# Patient Record
Sex: Male | Born: 1940 | Race: White | Hispanic: No | Marital: Married | Smoking: Never smoker
Health system: Southern US, Community
[De-identification: ages and names within clinical notes are randomized; demographics above are authoritative.]

## PROBLEM LIST (undated history)

## (undated) DIAGNOSIS — M1990 Unspecified osteoarthritis, unspecified site: Secondary | ICD-10-CM

## (undated) DIAGNOSIS — R059 Cough, unspecified: Secondary | ICD-10-CM

## (undated) DIAGNOSIS — H9190 Unspecified hearing loss, unspecified ear: Secondary | ICD-10-CM

## (undated) DIAGNOSIS — R05 Cough: Secondary | ICD-10-CM

## (undated) DIAGNOSIS — I10 Essential (primary) hypertension: Secondary | ICD-10-CM

## (undated) HISTORY — PX: EYE SURGERY: SHX253

## (undated) HISTORY — PX: JOINT REPLACEMENT: SHX530

## (undated) HISTORY — PX: OTHER SURGICAL HISTORY: SHX169

## (undated) HISTORY — PX: BACK SURGERY: SHX140

---

## 2009-11-09 ENCOUNTER — Ambulatory Visit: Payer: Self-pay | Admitting: Ophthalmology

## 2009-11-14 ENCOUNTER — Ambulatory Visit: Payer: Self-pay | Admitting: Ophthalmology

## 2015-08-14 ENCOUNTER — Encounter: Payer: Self-pay | Admitting: Emergency Medicine

## 2015-08-14 ENCOUNTER — Emergency Department
Admission: EM | Admit: 2015-08-14 | Discharge: 2015-08-14 | Disposition: A | Discharge status: Home / Self Care | Payer: Medicare Other | Attending: Emergency Medicine | Admitting: Emergency Medicine

## 2015-08-14 ENCOUNTER — Emergency Department: Payer: Medicare Other

## 2015-08-14 DIAGNOSIS — I10 Essential (primary) hypertension: Secondary | ICD-10-CM | POA: Diagnosis not present

## 2015-08-14 DIAGNOSIS — L03113 Cellulitis of right upper limb: Secondary | ICD-10-CM | POA: Insufficient documentation

## 2015-08-14 DIAGNOSIS — M7989 Other specified soft tissue disorders: Secondary | ICD-10-CM

## 2015-08-14 DIAGNOSIS — M79641 Pain in right hand: Secondary | ICD-10-CM | POA: Diagnosis present

## 2015-08-14 HISTORY — DX: Essential (primary) hypertension: I10

## 2015-08-14 LAB — COMPREHENSIVE METABOLIC PANEL
ALT: 30 U/L (ref 17–63)
AST: 25 U/L (ref 15–41)
Albumin: 4 g/dL (ref 3.5–5.0)
Alkaline Phosphatase: 42 U/L (ref 38–126)
Anion gap: 5 (ref 5–15)
BUN: 20 mg/dL (ref 6–20)
CO2: 26 mmol/L (ref 22–32)
CREATININE: 0.93 mg/dL (ref 0.61–1.24)
Calcium: 9.1 mg/dL (ref 8.9–10.3)
Chloride: 105 mmol/L (ref 101–111)
GFR calc Af Amer: >60 mL/min (ref >60)
GFR calc non Af Amer: >60 mL/min (ref >60)
Glucose, Bld: 91 mg/dL (ref 65–99)
Potassium: 4.5 mmol/L (ref 3.5–5.1)
SODIUM: 136 mmol/L (ref 135–145)
Total Bilirubin: 0.8 mg/dL (ref 0.3–1.2)
Total Protein: 7.1 g/dL (ref 6.5–8.1)

## 2015-08-14 LAB — CBC WITH DIFFERENTIAL/PLATELET
BASOS ABS: 0.1 10*3/uL (ref 0–0.1)
Basophils Relative: 1 %
EOS ABS: 0.3 10*3/uL (ref 0–0.7)
EOS PCT: 4 %
HCT: 45.7 % (ref 40.0–52.0)
Hemoglobin: 15.5 g/dL (ref 13.0–18.0)
LYMPHS ABS: 3.4 10*3/uL (ref 1.0–3.6)
Lymphocytes Relative: 44 %
MCH: 30.8 pg (ref 26.0–34.0)
MCHC: 34 g/dL (ref 32.0–36.0)
MCV: 90.6 fL (ref 80.0–100.0)
Monocytes Absolute: 1 10*3/uL (ref 0.2–1.0)
Monocytes Relative: 13 %
NEUTROS PCT: 38 %
Neutro Abs: 2.9 10*3/uL (ref 1.4–6.5)
PLATELETS: 127 10*3/uL — AB (ref 150–440)
RBC: 5.04 MIL/uL (ref 4.40–5.90)
RDW: 13.4 % (ref 11.5–14.5)
WBC: 7.6 10*3/uL (ref 3.8–10.6)

## 2015-08-14 LAB — URIC ACID: Uric Acid, Serum: 7.3 mg/dL (ref 4.4–7.6)

## 2015-08-14 MED ORDER — HYDROCODONE-ACETAMINOPHEN 5-325 MG PO TABS
1.0000 | ORAL_TABLET | Freq: Once | ORAL | Status: AC
  Administered 2015-08-14: 1 via ORAL

## 2015-08-14 MED ORDER — HYDROCODONE-ACETAMINOPHEN 5-325 MG PO TABS
ORAL_TABLET | ORAL | Status: DC
Start: 2015-08-14 — End: 2015-08-14
  Filled 2015-08-14: qty 1

## 2015-08-14 MED ORDER — ETODOLAC 200 MG PO CAPS: 200.0000 mg | ORAL_CAPSULE | Freq: Three times a day (TID) | ORAL | Status: DC

## 2015-08-14 MED ORDER — HYDROCODONE-ACETAMINOPHEN 5-325 MG PO TABS
ORAL_TABLET | ORAL | Status: AC
  Filled 2015-08-14: qty 1

## 2015-08-14 MED ORDER — CLINDAMYCIN HCL 300 MG PO CAPS: 300.0000 mg | ORAL_CAPSULE | Freq: Three times a day (TID) | ORAL | Status: DC

## 2015-08-14 MED ORDER — CLINDAMYCIN HCL 150 MG PO CAPS
300.0000 mg | ORAL_CAPSULE | Freq: Once | ORAL | Status: AC
  Administered 2015-08-14: 300 mg via ORAL

## 2015-08-14 MED ORDER — CLINDAMYCIN HCL 150 MG PO CAPS
ORAL_CAPSULE | ORAL | Status: AC
  Filled 2015-08-14: qty 2

## 2015-08-14 NOTE — ED Notes (Signed)
MD Webster at bedside 

## 2015-08-14 NOTE — ED Notes (Signed)
Patient transported to Ultrasound 

## 2015-08-14 NOTE — ED Notes (Signed)
Pt reports he noticed swelling/redness/pain of right hand, extending towards elbow beginning at 18:30 on 3/26. Pt is able to extend finger, pt is unable to make a fist. Pt denies nausea, vomiting, fever/chills

## 2015-08-14 NOTE — ED Notes (Signed)
Pt states that he started having pain in the rt hand around 1830, pt states that he then noticed that his hand was swollen with pain up to the rt elbow, denies any injury, pt has some redness noted o the rt hand and rt wrist area. +radial pulse, warm to touch, pt able to move his fingers but with pain

## 2015-08-14 NOTE — ED Provider Notes (Signed)
[facility] Emergency Department Provider Note  ____________________________________________  Time seen: Approximately 424 AM  I have reviewed the triage vital signs and the nursing notes.   HISTORY  Chief Complaint Hand Pain    HPI Kevin Butler is a 75 y.o. male who comes into the hospital today with right hand pain and swelling.The patient reports that it started around 6:30 to 7 PM this evening with some swelling of his hand and pain into his right elbow. He also noticed some redness in his hand and redness going up his arm. 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 The patient reports he has some circulation problems and has had that in the past. He took some ibuprofen and it didn't help. The patient rates pain 8 out of 10 in intensity. He was given hydrocodone and the waiting room and he reports to help some but the pain is coming back. The patient is unsure what may be causing his symptoms so he decided to come in to the hospital to get checked out.   Past Medical History  Diagnosis Date  . Hypertension     There are no active problems to display for this patient.   Past Surgical History  Procedure Laterality Date  . Back surgery    . Joint replacement      Current Outpatient Rx  Name  Route  Sig  Dispense  Refill  . clindamycin (CLEOCIN) 300 MG capsule   Oral   Take 1 capsule (300 mg total) by mouth 3 (three) times daily.   30 capsule   0   . etodolac (LODINE) 200 MG capsule   Oral   Take 1 capsule (200 mg total) by mouth every 8 (eight) hours.   12 capsule   0     Allergies Iodine  No family history on file.  Social History Social History  Substance Use Topics  . Smoking status: Never Smoker   . Smokeless tobacco: Not on file  . Alcohol Use: Yes    Review of Systems Constitutional: No fever/chills Eyes: No visual changes. ENT: No sore throat. Cardiovascular: Denies chest  pain. Respiratory: Denies shortness of breath. Gastrointestinal: No abdominal pain.  No nausea, no vomiting.  No diarrhea.  No constipation. Genitourinary: Negative for dysuria. Musculoskeletal: right and swelling and pain Skin: right hand redness Neurological: Negative for headaches, focal weakness or numbness.  10-point ROS otherwise negative.  ____________________________________________   PHYSICAL EXAM:  VITAL SIGNS: ED Triage Vitals  Enc Vitals Group     BP 08/14/15 0015 167/78 mmHg     Pulse Rate 08/14/15 0015 73     Resp 08/14/15 0015 20     Temp 08/14/15 0015 97.6 F (36.4 C)     Temp Source 08/14/15 0015 Oral     SpO2 08/14/15 0015 98 %     Weight 08/14/15 0015 238 lb (107.956 kg)     Height 08/14/15 0015 5\' 11"  (1.803 m)     Head Cir --      Peak Flow --      Pain Score 08/14/15 0015 9     Pain Loc --      Pain Edu? --      Excl. in GC? --     Constitutional: Alert and oriented. Well appearing and in no acute distress. Eyes: Conjunctivae are normal. PERRL. EOMI. Head: Atraumatic. Nose: No congestion/rhinnorhea. Mouth/Throat: Mucous membranes are moist.  Oropharynx non-erythematous. Cardiovascular: Normal rate,  regular rhythm. Grossly normal heart sounds.  Good peripheral circulation. Respiratory: Normal respiratory effort.  No retractions. Lungs CTAB. Gastrointestinal: Soft and nontender. No distention.positive bowel sounds Musculoskeletal: Swelling to right hand with some mild redness. The patient has some mild streaking on the medial surface of his right forearm. Minimal tenderness to palpation of right forearm but some mild tenderness to palpation of right hand. Neurologic:  Normal speech and language.  Skin:  Skin is warm, dry and intact.  Psychiatric: Mood and affect are normal.   ____________________________________________   LABS (all labs ordered are listed, but only abnormal results are displayed)  Labs Reviewed  CBC WITH DIFFERENTIAL/PLATELET  - Abnormal; Notable for the following:    Platelets 127 (*)    All other components within normal limits  COMPREHENSIVE METABOLIC PANEL  URIC ACID   ____________________________________________  EKG  None ____________________________________________  RADIOLOGY  Ultrasound venous right upper extremity: No evidence of DVT ____________________________________________   PROCEDURES  Procedure(s) performed: None  Critical Care performed: No  ____________________________________________   INITIAL IMPRESSION / ASSESSMENT AND PLAN / ED COURSE  Pertinent labs & imaging results that were available during my care of the patient were reviewed by me and considered in my medical decision making (see chart for details).  This is a 76 year old male who comes into the hospital today with some right hand swelling and pain as well as some redness to his right hand. The patient does have a scratch to his right medial forearm. I'm concerned the patient may have some cellulitis which may be causing his symptoms. The patient may also have some arthritis. I will give the patient a dose of clindamycin as well as a repeat dose of his hydrocodone. I will treat the patient with etodolac and clindamycin and have him follow up with his primary care physician.   The patient's ultrasound is unremarkable. His blood work is also negative. He'll be discharged home to follow-up with his primary care physician for further evaluation of this hand pain. ____________________________________________   FINAL CLINICAL IMPRESSION(S) / ED DIAGNOSES  Final diagnoses:  Hand swelling  Cellulitis of right upper extremity      Rebecka Apley, MD 08/14/15 [phone]

## 2015-08-14 NOTE — Discharge Instructions (Signed)
Cellulitis °Cellulitis is an infection of the skin and the tissue beneath it. The infected area is usually red and tender. Cellulitis occurs most often in the arms and lower legs.  °CAUSES  °Cellulitis is caused by bacteria that enter the skin through cracks or cuts in the skin. The most common types of bacteria that cause cellulitis are staphylococci and streptococci. °SIGNS AND SYMPTOMS  °· Redness and warmth. °· Swelling. °· Tenderness or pain. °· Fever. °DIAGNOSIS  °Your health care provider can usually determine what is wrong based on a physical exam. Blood tests may also be done. °TREATMENT  °Treatment usually involves taking an antibiotic medicine. °HOME CARE INSTRUCTIONS  °· Take your antibiotic medicine as directed by your health care provider. Finish the antibiotic even if you start to feel better. °· Keep the infected arm or leg elevated to reduce swelling. °· Apply a warm cloth to the affected area up to 4 times per day to relieve pain. °· Take medicines only as directed by your health care provider. °· Keep all follow-up visits as directed by your health care provider. °SEEK MEDICAL CARE IF:  °· You notice red streaks coming from the infected area. °· Your red area gets larger or turns dark in color. °· Your bone or joint underneath the infected area becomes painful after the skin has healed. °· Your infection returns in the same area or another area. °· You notice a swollen bump in the infected area. °· You develop new symptoms. °· You have a fever. °SEEK IMMEDIATE MEDICAL CARE IF:  °· You feel very sleepy. °· You develop vomiting or diarrhea. °· You have a general ill feeling (malaise) with muscle aches and pains. °  °This information is not intended to replace advice given to you by your health care provider. Make sure you discuss any questions you have with your health care provider. °  °Document Released: 02/13/2005 Document Revised: 01/25/2015 Document Reviewed: 07/22/2011 °Elsevier Interactive  Patient Education ©2016 [employer] ° °Edema °Edema is an abnormal buildup of fluids in your body tissues. Edema is somewhat dependent on gravity to pull the fluid to the lowest place in your body. That makes the condition more common in the legs and thighs (lower extremities). Painless swelling of the feet and ankles is common and becomes more likely as you get older. It is also common in looser tissues, like around your eyes.  °When the affected area is squeezed, the fluid may move out of that spot and leave a dent for a few moments. This dent is called pitting.  °CAUSES  °There are many possible causes of edema. Eating too much salt and being on your feet or sitting for a long time can cause edema in your legs and ankles. Hot weather may make edema worse. Common medical causes of edema include: °· Heart failure. °· Liver disease. °· Kidney disease. °· Weak blood vessels in your legs. °· Cancer. °· An injury. °· Pregnancy. °· Some medications. °· Obesity.  °SYMPTOMS  °Edema is usually painless. Your skin may look swollen or shiny.  °DIAGNOSIS  °Your health care provider may be able to diagnose edema by asking about your medical history and doing a physical exam. You may need to have tests such as X-rays, an electrocardiogram, or blood tests to check for medical conditions that may cause edema.  °TREATMENT  °Edema treatment depends on the cause. If you have heart, liver, or kidney disease, you need the treatment appropriate for these conditions. General treatment may include: °·   Elevation of the affected body part above the level of your heart. °· Compression of the affected body part. Pressure from elastic bandages or support stockings squeezes the tissues and forces fluid back into the blood vessels. This keeps fluid from entering the tissues. °· Restriction of fluid and salt intake. °· Use of a water pill (diuretic). These medications are appropriate only for some types of edema. They pull fluid out of your  body and make you urinate more often. This gets rid of fluid and reduces swelling, but diuretics can have side effects. Only use diuretics as directed by your health care provider. °HOME CARE INSTRUCTIONS  °· Keep the affected body part above the level of your heart when you are lying down.   °· Do not sit still or stand for prolonged periods.   °· Do not put anything directly under your knees when lying down. °· Do not wear constricting clothing or garters on your upper legs.   °· Exercise your legs to work the fluid back into your blood vessels. This may help the swelling go down.   °· Wear elastic bandages or support stockings to reduce ankle swelling as directed by your health care provider.   °· Eat a low-salt diet to reduce fluid if your health care provider recommends it.   °· Only take medicines as directed by your health care provider.  °SEEK MEDICAL CARE IF:  °· Your edema is not responding to treatment. °· You have heart, liver, or kidney disease and notice symptoms of edema. °· You have edema in your legs that does not improve after elevating them.   °· You have sudden and unexplained weight gain. °SEEK IMMEDIATE MEDICAL CARE IF:  °· You develop shortness of breath or chest pain.   °· You cannot breathe when you lie down. °· You develop pain, redness, or warmth in the swollen areas.   °· You have heart, liver, or kidney disease and suddenly get edema. °· You have a fever and your symptoms suddenly get worse. °MAKE SURE YOU:  °· Understand these instructions. °· Will watch your condition. °· Will get help right away if you are not doing well or get worse. °  °This information is not intended to replace advice given to you by your health care provider. Make sure you discuss any questions you have with your health care provider. °  °Document Released: 05/06/2005 Document Revised: 05/27/2014 Document Reviewed: 02/26/2013 °Elsevier Interactive Patient Education ©2016 Elsevier Inc. ° °

## 2017-02-27 ENCOUNTER — Encounter: Payer: Self-pay | Admitting: *Deleted

## 2017-03-04 ENCOUNTER — Ambulatory Visit: Payer: Medicare Other | Admitting: Anesthesiology

## 2017-03-04 ENCOUNTER — Ambulatory Visit
Admission: RE | Admit: 2017-03-04 | Discharge: 2017-03-04 | Disposition: A | Discharge status: Home / Self Care | Payer: Medicare Other | Source: Ambulatory Visit | Attending: Ophthalmology | Admitting: Ophthalmology

## 2017-03-04 ENCOUNTER — Encounter
Admission: RE | Disposition: A | Discharge status: Home / Self Care | Payer: Self-pay | Source: Ambulatory Visit | Attending: Ophthalmology

## 2017-03-04 DIAGNOSIS — E669 Obesity, unspecified: Secondary | ICD-10-CM | POA: Diagnosis not present

## 2017-03-04 DIAGNOSIS — Z91041 Radiographic dye allergy status: Secondary | ICD-10-CM | POA: Insufficient documentation

## 2017-03-04 DIAGNOSIS — R05 Cough: Secondary | ICD-10-CM | POA: Diagnosis not present

## 2017-03-04 DIAGNOSIS — H2511 Age-related nuclear cataract, right eye: Secondary | ICD-10-CM | POA: Diagnosis not present

## 2017-03-04 DIAGNOSIS — Z79899 Other long term (current) drug therapy: Secondary | ICD-10-CM | POA: Diagnosis not present

## 2017-03-04 DIAGNOSIS — Z96659 Presence of unspecified artificial knee joint: Secondary | ICD-10-CM | POA: Diagnosis not present

## 2017-03-04 DIAGNOSIS — H9190 Unspecified hearing loss, unspecified ear: Secondary | ICD-10-CM | POA: Insufficient documentation

## 2017-03-04 DIAGNOSIS — M1990 Unspecified osteoarthritis, unspecified site: Secondary | ICD-10-CM | POA: Insufficient documentation

## 2017-03-04 DIAGNOSIS — I10 Essential (primary) hypertension: Secondary | ICD-10-CM | POA: Diagnosis not present

## 2017-03-04 DIAGNOSIS — Z9849 Cataract extraction status, unspecified eye: Secondary | ICD-10-CM | POA: Diagnosis not present

## 2017-03-04 HISTORY — DX: Unspecified hearing loss, unspecified ear: H91.90

## 2017-03-04 HISTORY — DX: Unspecified osteoarthritis, unspecified site: M19.90

## 2017-03-04 HISTORY — DX: Cough: R05

## 2017-03-04 HISTORY — PX: CATARACT EXTRACTION W/PHACO: SHX586

## 2017-03-04 HISTORY — DX: Cough, unspecified: R05.9

## 2017-03-04 SURGERY — PHACOEMULSIFICATION, CATARACT, WITH IOL INSERTION
Anesthesia: Monitor Anesthesia Care | Site: Eye | Laterality: Right | Wound class: Clean

## 2017-03-04 MED ORDER — NA CHONDROIT SULF-NA HYALURON 40-17 MG/ML IO SOLN
INTRAOCULAR | Status: DC | PRN
  Administered 2017-03-04: 1 mL via INTRAOCULAR

## 2017-03-04 MED ORDER — ARMC OPHTHALMIC DILATING DROPS
OPHTHALMIC | Status: AC
  Filled 2017-03-04: qty 0.4

## 2017-03-04 MED ORDER — NA CHONDROIT SULF-NA HYALURON 40-17 MG/ML IO SOLN
INTRAOCULAR | Status: AC
  Filled 2017-03-04: qty 1

## 2017-03-04 MED ORDER — MOXIFLOXACIN HCL 0.5 % OP SOLN
OPHTHALMIC | Status: AC
  Filled 2017-03-04: qty 3

## 2017-03-04 MED ORDER — MIDAZOLAM HCL 5 MG/5ML IJ SOLN
INTRAMUSCULAR | Status: DC | PRN
  Administered 2017-03-04: 2 mg via INTRAVENOUS

## 2017-03-04 MED ORDER — EPINEPHRINE PF 1 MG/ML IJ SOLN
INTRAOCULAR | Status: DC | PRN
  Administered 2017-03-04: 1 mL via OPHTHALMIC

## 2017-03-04 MED ORDER — MOXIFLOXACIN HCL 0.5 % OP SOLN
OPHTHALMIC | Status: DC | PRN
  Administered 2017-03-04: .2 mL via OPHTHALMIC

## 2017-03-04 MED ORDER — LIDOCAINE HCL (PF) 4 % IJ SOLN
INTRAOCULAR | Status: DC | PRN
  Administered 2017-03-04: 2 mL via OPHTHALMIC

## 2017-03-04 MED ORDER — MOXIFLOXACIN HCL 0.5 % OP SOLN: 1.0000 [drp] | OPHTHALMIC | Status: DC | PRN

## 2017-03-04 MED ORDER — SODIUM CHLORIDE 0.9 % IV SOLN
INTRAVENOUS | Status: DC
  Administered 2017-03-04 (×2): via INTRAVENOUS

## 2017-03-04 MED ORDER — MIDAZOLAM HCL 2 MG/2ML IJ SOLN
INTRAMUSCULAR | Status: AC
  Filled 2017-03-04: qty 2

## 2017-03-04 MED ORDER — LIDOCAINE HCL (PF) 4 % IJ SOLN
INTRAMUSCULAR | Status: AC
  Filled 2017-03-04: qty 5

## 2017-03-04 MED ORDER — ARMC OPHTHALMIC DILATING DROPS
1.0000 "application " | OPHTHALMIC | Status: AC
  Administered 2017-03-04 (×3): 1 via OPHTHALMIC

## 2017-03-04 MED ORDER — POVIDONE-IODINE 5 % OP SOLN
OPHTHALMIC | Status: AC
  Filled 2017-03-04: qty 30

## 2017-03-04 MED ORDER — POVIDONE-IODINE 5 % OP SOLN
OPHTHALMIC | Status: DC | PRN
  Administered 2017-03-04: 1 via OPHTHALMIC

## 2017-03-04 MED ORDER — EPINEPHRINE PF 1 MG/ML IJ SOLN
INTRAMUSCULAR | Status: AC
  Filled 2017-03-04: qty 1

## 2017-03-04 MED ORDER — CARBACHOL 0.01 % IO SOLN
INTRAOCULAR | Status: DC | PRN
  Administered 2017-03-04: .5 mL via INTRAOCULAR

## 2017-03-04 SURGICAL SUPPLY — 17 items
GLOVE BIO SURGEON STRL SZ8 (GLOVE) ×3 IMPLANT
GLOVE BIOGEL M 6.5 STRL (GLOVE) ×3 IMPLANT
GLOVE SURG LX 8.0 MICRO (GLOVE) ×2
GLOVE SURG LX STRL 8.0 MICRO (GLOVE) ×1 IMPLANT
GOWN STRL REUS W/ TWL LRG LVL3 (GOWN DISPOSABLE) ×2 IMPLANT
GOWN STRL REUS W/TWL LRG LVL3 (GOWN DISPOSABLE) ×4
LABEL CATARACT MEDS ST (LABEL) ×3 IMPLANT
LENS IOL ACRSF IQ ULTRA 17.0 (Intraocular Lens) ×1 IMPLANT
LENS IOL ACRYSOF IQ 17.0 (Intraocular Lens) ×3 IMPLANT
PACK CATARACT (MISCELLANEOUS) ×3 IMPLANT
PACK CATARACT BRASINGTON LX (MISCELLANEOUS) ×3 IMPLANT
PACK EYE AFTER SURG (MISCELLANEOUS) ×3 IMPLANT
SOL BSS BAG (MISCELLANEOUS) ×3
SOLUTION BSS BAG (MISCELLANEOUS) ×1 IMPLANT
SYR 5ML LL (SYRINGE) ×3 IMPLANT
WATER STERILE IRR 250ML POUR (IV SOLUTION) ×3 IMPLANT
WIPE NON LINTING 3.25X3.25 (MISCELLANEOUS) ×3 IMPLANT

## 2017-03-04 NOTE — H&P (Signed)
All labs reviewed. Abnormal studies sent to patients PCP when indicated.  Previous H&P reviewed, patient examined, there are NO CHANGES.  Stacey Sago LOUIS10/16/201811:39 AM

## 2017-03-04 NOTE — Anesthesia Post-op Follow-up Note (Signed)
Anesthesia QCDR form completed.        

## 2017-03-04 NOTE — Anesthesia Preprocedure Evaluation (Addendum)
Anesthesia Evaluation  Patient identified by MRN, date of birth, ID band Patient awake    Reviewed: Allergy & Precautions, NPO status , Patient's Chart, lab work & pertinent test results, reviewed documented beta blocker date and time   Airway Mallampati: III  TM Distance: >3 FB     Dental  (+) Chipped, Missing   Pulmonary           Cardiovascular hypertension, Pt. on medications      Neuro/Psych    GI/Hepatic   Endo/Other    Renal/GU      Musculoskeletal  (+) Arthritis ,   Abdominal   Peds  Hematology   Anesthesia Other Findings Obese.  Reproductive/Obstetrics                            Anesthesia Physical Anesthesia Plan  ASA: III  Anesthesia Plan: MAC   Post-op Pain Management:    Induction:   PONV Risk Score and Plan:   Airway Management Planned:   Additional Equipment:   Intra-op Plan:   Post-operative Plan:   Informed Consent: I have reviewed the patients History and Physical, chart, labs and discussed the procedure including the risks, benefits and alternatives for the proposed anesthesia with the patient or authorized representative who has indicated his/her understanding and acceptance.     Plan Discussed with: CRNA  Anesthesia Plan Comments:         Anesthesia Quick Evaluation

## 2017-03-04 NOTE — Anesthesia Postprocedure Evaluation (Signed)
Anesthesia Post Note  Patient: Kevin Butler  Procedure(s) Performed: CATARACT EXTRACTION PHACO AND INTRAOCULAR LENS PLACEMENT (IOC) (Right Eye)  Patient location during evaluation: PACU Anesthesia Type: MAC Level of consciousness: awake and alert Pain management: pain level controlled Vital Signs Assessment: post-procedure vital signs reviewed and stable Respiratory status: spontaneous breathing, nonlabored ventilation and respiratory function stable Cardiovascular status: stable and blood pressure returned to baseline Postop Assessment: no apparent nausea or vomiting Anesthetic complications: no     Last Vitals:  Vitals:   03/04/17 1001 03/04/17 1208  BP: (!) 169/98 (!) 141/93  Pulse: 86   Resp: 16   Temp: 36.6 C 36.6 C  SpO2: 96% 100%    Last Pain:  Vitals:   03/04/17 1001  TempSrc: Oral                 Silvana Newness A

## 2017-03-04 NOTE — OR Nursing (Signed)
Dr Druscilla Brownie assessed left eye for infection. No new orders at this time

## 2017-03-04 NOTE — Discharge Instructions (Signed)
Eye Surgery Discharge Instructions  Expect mild scratchy sensation or mild soreness. DO NOT RUB YOUR EYE!  The day of surgery:  Minimal physical activity, but bed rest is not required  No reading, computer work, or close hand work  No bending, lifting, or straining.  May watch TV  For 24 hours:  No driving, legal decisions, or alcoholic beverages  Safety precautions  Eat anything you prefer: It is better to start with liquids, then soup then solid foods.  _____ Eye patch should be worn until postoperative exam tomorrow.  ____ Solar shield eyeglasses should be worn for comfort in the sunlight/patch while sleeping  Resume all regular medications including aspirin or Coumadin if these were discontinued prior to surgery. You may shower, bathe, shave, or wash your hair. Tylenol may be taken for mild discomfort.  Call your doctor if you experience significant pain, nausea, or vomiting, fever > 101 or other signs of infection. [phone] or [phone] Specific instructions:  Follow-up Information    Galen Manila, MD Follow up on 03/05/2017.   Specialty:  Ophthalmology Why:  9:55 Contact information: [address] [place] [address] [phone]

## 2017-03-04 NOTE — Transfer of Care (Signed)
Immediate Anesthesia Transfer of Care Note  Patient: Kevin Butler  Procedure(s) Performed: CATARACT EXTRACTION PHACO AND INTRAOCULAR LENS PLACEMENT (IOC) (Right Eye)  Patient Location: PACU  Anesthesia Type:MAC  Level of Consciousness: awake, alert , oriented and patient cooperative  Airway & Oxygen Therapy: Patient Spontanous Breathing  Post-op Assessment: Report given to RN, Post -op Vital signs reviewed and stable and Patient moving all extremities X 4  Post vital signs: Reviewed and stable  Last Vitals:  Vitals:   03/04/17 1001 03/04/17 1208  BP: (!) 169/98 (!) 141/93  Pulse: 86   Resp: 16   Temp: 36.6 C 36.6 C  SpO2: 96% 100%    Last Pain:  Vitals:   03/04/17 1001  TempSrc: Oral         Complications: No apparent anesthesia complications

## 2017-03-04 NOTE — Op Note (Signed)
PREOPERATIVE DIAGNOSIS:  Nuclear sclerotic cataract of the right eye.   POSTOPERATIVE DIAGNOSIS:  nuclear sclerotic cataract right eye   OPERATIVE PROCEDURE: Procedure(s): CATARACT EXTRACTION PHACO AND INTRAOCULAR LENS PLACEMENT (IOC)   SURGEON:  Oralee Rapaport Galen Manila   ANESTHESIA:  Anesthesiologist: Berdine Addison, MD CRNA: Michaele Offer, CRNA  1.      Managed anesthesia care. 2.      0.48ml of Shugarcaine was instilled in the eye following the paracentesis.   COMPLICATIONS:  None.   TECHNIQUE:   Stop and chop   DESCRIPTION OF PROCEDURE:  The patient was examined and consented in the preoperative holding area where the aforementioned topical anesthesia was applied to the right eye and then brought back to the Operating Room where the right eye was prepped and draped in the usual sterile ophthalmic fashion and a lid speculum was placed. A paracentesis was created with the side port blade and the anterior chamber was filled with viscoelastic. A near clear corneal incision was performed with the steel keratome. A continuous curvilinear capsulorrhexis was performed with a cystotome followed by the capsulorrhexis forceps. Hydrodissection and hydrodelineation were carried out with BSS on a blunt cannula. The lens was removed in a stop and chop  technique and the remaining cortical material was removed with the irrigation-aspiration handpiece. The capsular bag was inflated with viscoelastic and the Technis [id number]  lens was placed in the capsular bag without complication. The remaining viscoelastic was removed from the eye with the irrigation-aspiration handpiece. The wounds were hydrated. The anterior chamber was flushed with Miostat and the eye was inflated to physiologic pressure. 0.57ml of Vigamox was placed in the anterior chamber. The wounds were found to be water tight. The eye was dressed with Vigamox. The patient was given protective glasses to wear throughout the day and a shield with which to  sleep tonight. The patient was also given drops with which to begin a drop regimen today and will follow-up with me in one day.  Implant Name Type Inv. Item Serial No. Manufacturer Lot No. LRB No. Used  LENS IOL ACRYSOF IQ 17.0 - [id number] Intraocular Lens LENS IOL ACRYSOF IQ 17.0 [id number] ALCON   Right 1   Procedure(s) with comments: CATARACT EXTRACTION PHACO AND INTRAOCULAR LENS PLACEMENT (IOC) (Right) - [place] 01:44 AP% 23.3 CDE 24.41 Fluid pack lot # [id number]  Electronically signed: Shivangi Lutz LOUIS 03/04/2017 12:06 PM

## 2017-07-26 IMAGING — US US EXTREM  UP VENOUS*R*
1 series · 13 of 24 positions shown · non-contrast
Comparison: None.

CLINICAL DATA: Acute onset of right hand erythema, swelling and
pain, extending toward the elbow. Initial encounter.



[Series 1: us extrem up venous*right* · 0.08mm/px · 13 of 37 slices shown]
[im 1/37]
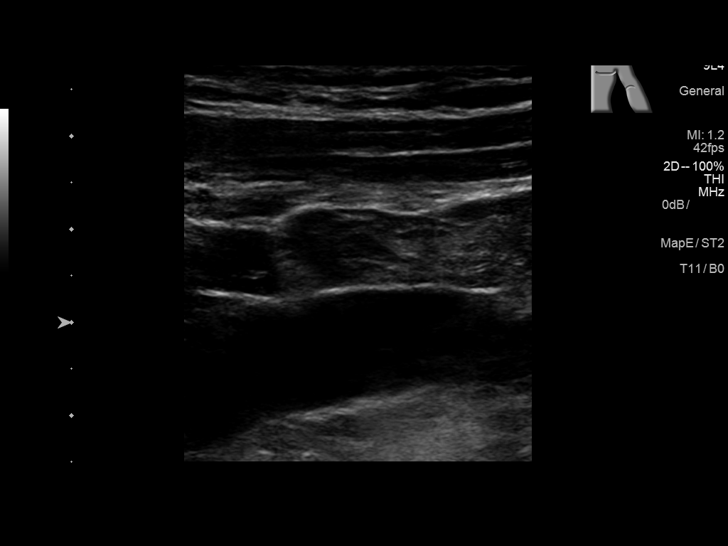
[im 4/37]
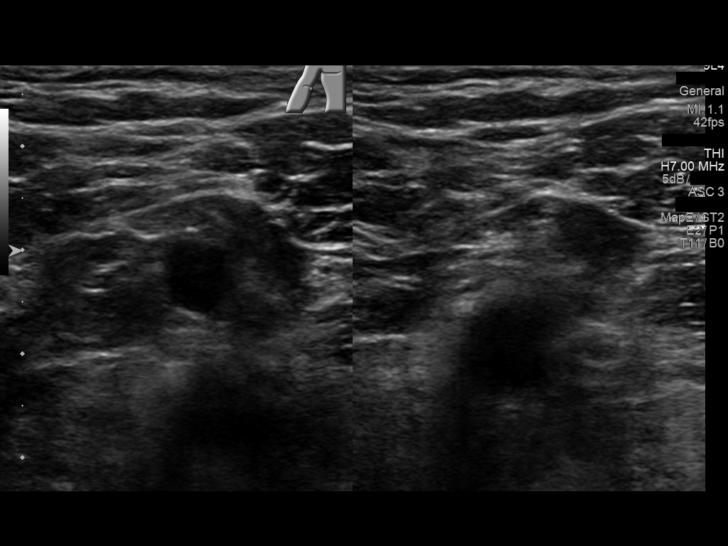
[im 7/37]
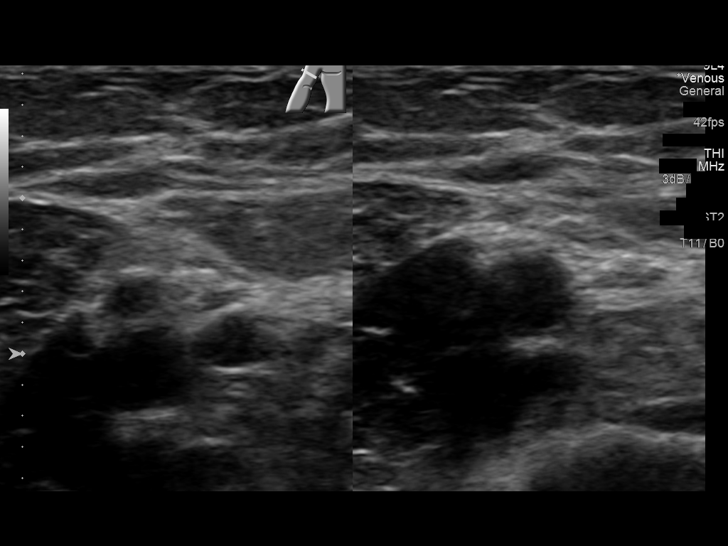
[im 10/37]
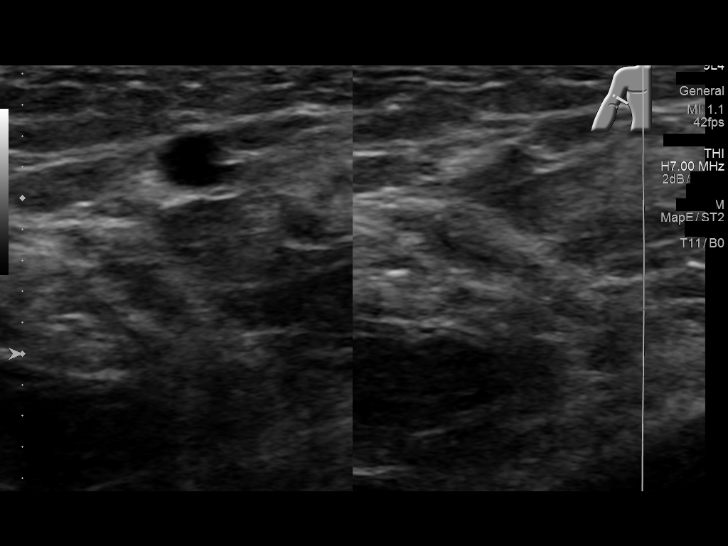
[im 13/37]
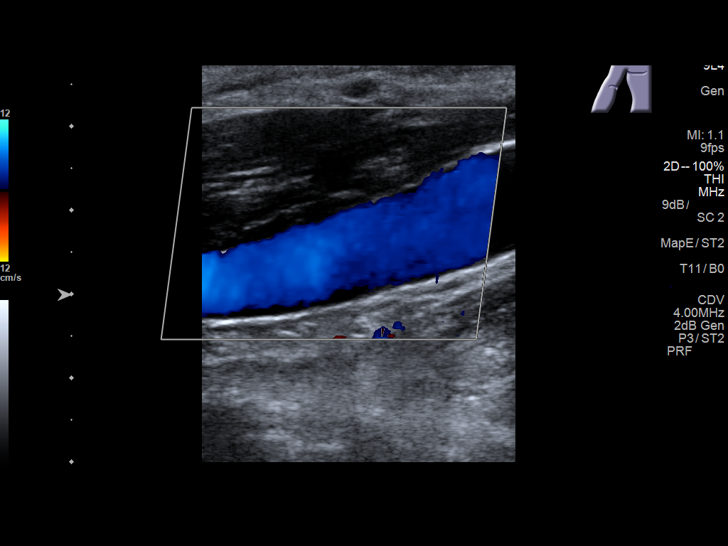
[im 16/37]
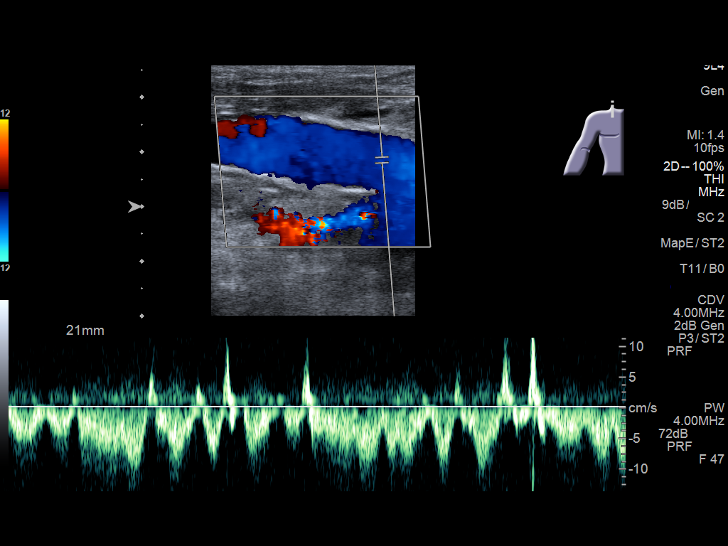
[im 19/37]
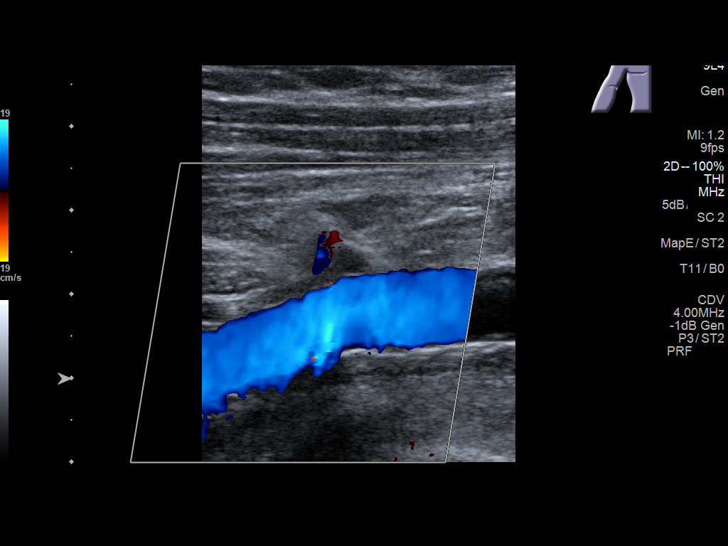
[im 21/37]
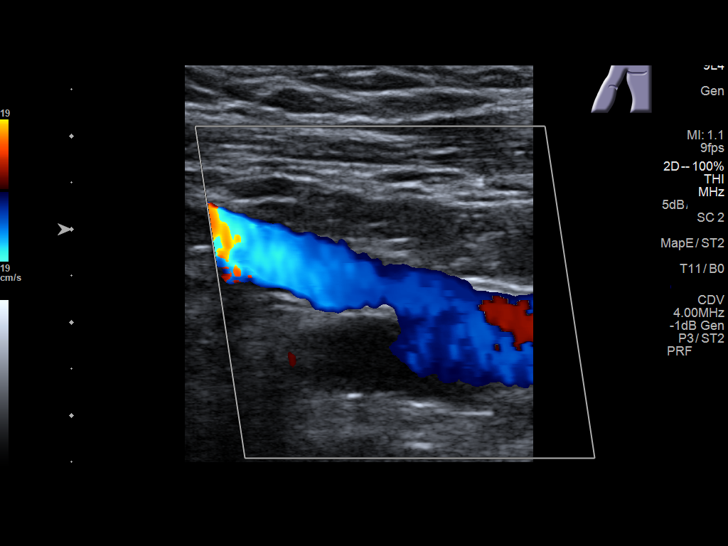
[im 24/37]
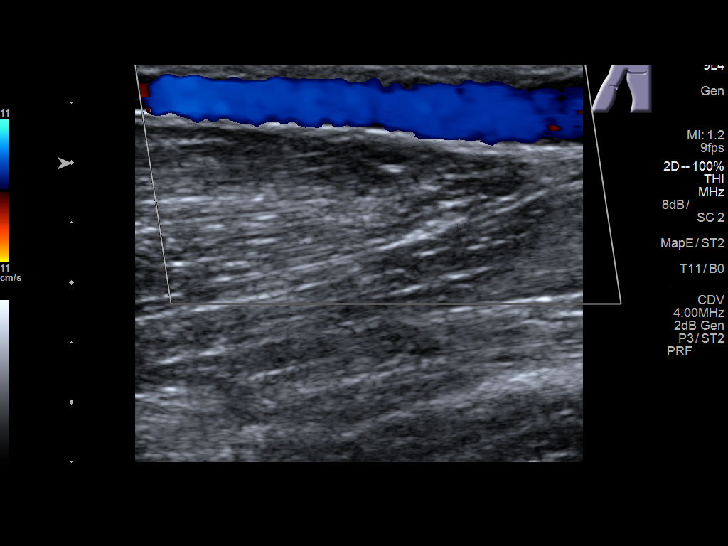
[im 27/37]
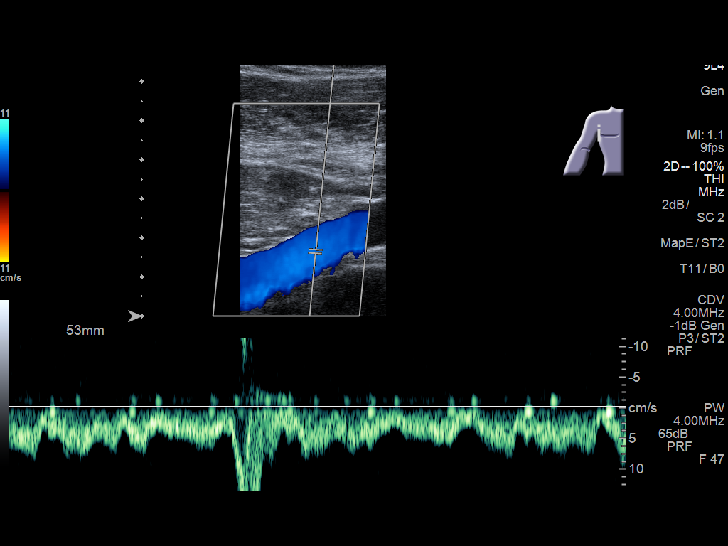
[im 30/37]
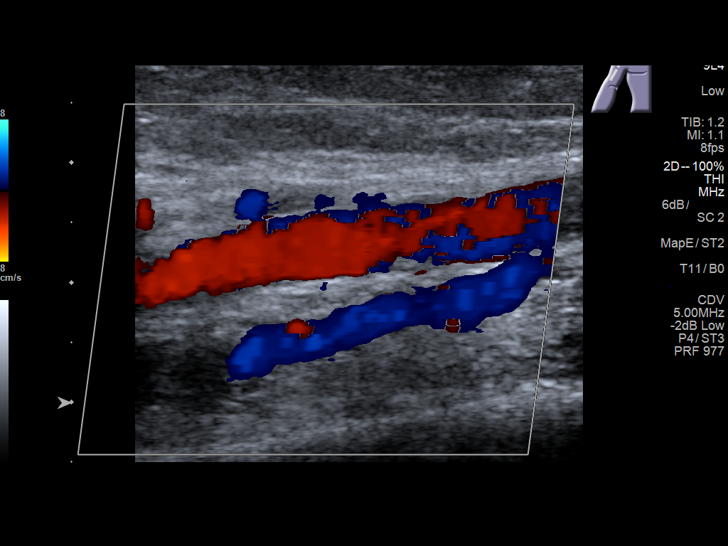
[im 33/37]
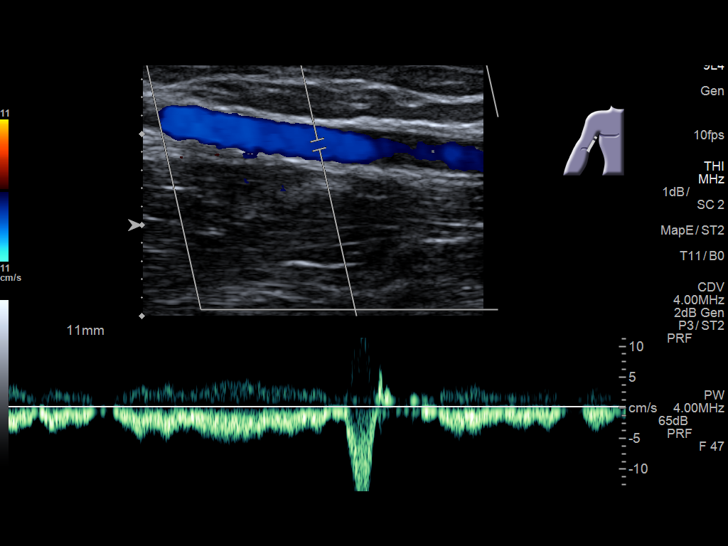
[im 37/37]
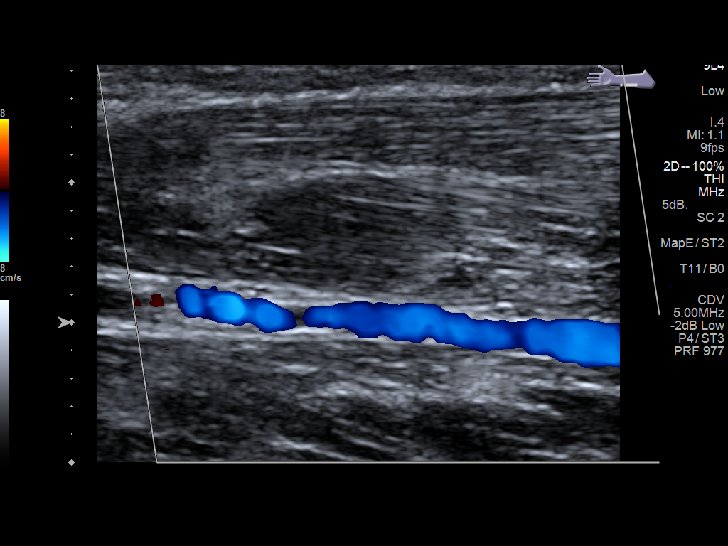

[13 of 24 positions shown; findings below may reference images not displayed]

FINDINGS: Contralateral Subclavian Vein: Respiratory phasicity is normal and
symmetric with the symptomatic side. No evidence of thrombus. Normal
compressibility.

Internal Jugular Vein: No evidence of thrombus. Normal
compressibility, respiratory phasicity and response to augmentation.

Subclavian Vein: No evidence of thrombus. Normal compressibility,
respiratory phasicity and response to augmentation.

Axillary Vein: No evidence of thrombus. Normal compressibility,
respiratory phasicity and response to augmentation.

Cephalic Vein: No evidence of thrombus. Normal compressibility,
respiratory phasicity and response to augmentation.

Basilic Vein: No evidence of thrombus. Normal compressibility,
respiratory phasicity and response to augmentation.

Brachial Veins: No evidence of thrombus. Normal compressibility,
respiratory phasicity and response to augmentation.

Radial Veins: No evidence of thrombus. Normal compressibility,
respiratory phasicity and response to augmentation.

Ulnar Veins: No evidence of thrombus. Normal compressibility,
respiratory phasicity and response to augmentation.

Venous Reflux:  None visualized.

Other Findings:  None visualized.
IMPRESSION: No evidence of deep venous thrombosis.

## 2018-08-24 ENCOUNTER — Encounter: Payer: Self-pay | Admitting: *Deleted

## 2018-09-18 DIAGNOSIS — I10 Essential (primary) hypertension: Secondary | ICD-10-CM | POA: Diagnosis not present

## 2018-09-18 DIAGNOSIS — R361 Hematospermia: Secondary | ICD-10-CM | POA: Diagnosis not present

## 2018-09-18 DIAGNOSIS — R634 Abnormal weight loss: Secondary | ICD-10-CM | POA: Diagnosis not present

## 2018-10-13 ENCOUNTER — Other Ambulatory Visit: Payer: Self-pay | Admitting: *Deleted

## 2018-10-13 NOTE — Patient Outreach (Signed)
[facility]) Care Management  10/13/2018  Kevin Butler 08-11-1940 [id number]   Health Risk Assessment screening call has been completed . Patient will receive appropriate information to the [facility] care management program.   Patient will be followed in the HTA, HRA engaged program.   Plan Will send welcome letter  Will plan follow up call within 6 months.    Egbert Garibaldi, RN, [facility] Care Management,Care Management Coordinator  [phone]- Mobile [phone]- Toll Free Main Office

## 2018-11-06 ENCOUNTER — Other Ambulatory Visit: Payer: Self-pay | Admitting: *Deleted

## 2018-11-06 NOTE — Patient Outreach (Signed)
[name]) Care Management  11/06/2018  Kevin Butler [birth year] [id number]   Patient  case has been transitioned to [facility] to continue Care Management services.    Joylene Draft, RN, [place] Management Coordinator  [phone]- Mobile [phone]- Toll Free Main Office

## 2018-11-25 DIAGNOSIS — R319 Hematuria, unspecified: Secondary | ICD-10-CM | POA: Diagnosis not present

## 2018-11-25 DIAGNOSIS — I10 Essential (primary) hypertension: Secondary | ICD-10-CM | POA: Diagnosis not present

## 2018-12-17 DIAGNOSIS — M25552 Pain in left hip: Secondary | ICD-10-CM | POA: Diagnosis not present

## 2018-12-17 DIAGNOSIS — M25551 Pain in right hip: Secondary | ICD-10-CM | POA: Diagnosis not present

## 2018-12-17 DIAGNOSIS — G8911 Acute pain due to trauma: Secondary | ICD-10-CM | POA: Diagnosis not present

## 2018-12-17 DIAGNOSIS — R634 Abnormal weight loss: Secondary | ICD-10-CM | POA: Diagnosis not present

## 2019-02-11 ENCOUNTER — Ambulatory Visit: Payer: Medicare Other | Admitting: *Deleted

## 2019-11-30 DIAGNOSIS — Z7289 Other problems related to lifestyle: Secondary | ICD-10-CM | POA: Diagnosis not present

## 2019-11-30 DIAGNOSIS — Z8639 Personal history of other endocrine, nutritional and metabolic disease: Secondary | ICD-10-CM | POA: Diagnosis not present

## 2019-11-30 DIAGNOSIS — Z1159 Encounter for screening for other viral diseases: Secondary | ICD-10-CM | POA: Diagnosis not present

## 2019-11-30 DIAGNOSIS — E785 Hyperlipidemia, unspecified: Secondary | ICD-10-CM | POA: Diagnosis not present

## 2019-11-30 DIAGNOSIS — I10 Essential (primary) hypertension: Secondary | ICD-10-CM | POA: Diagnosis not present

## 2020-06-06 DIAGNOSIS — I10 Essential (primary) hypertension: Secondary | ICD-10-CM | POA: Diagnosis not present

## 2020-06-06 DIAGNOSIS — L570 Actinic keratosis: Secondary | ICD-10-CM | POA: Diagnosis not present

## 2020-06-08 DIAGNOSIS — L905 Scar conditions and fibrosis of skin: Secondary | ICD-10-CM | POA: Diagnosis not present

## 2020-06-08 DIAGNOSIS — L538 Other specified erythematous conditions: Secondary | ICD-10-CM | POA: Diagnosis not present

## 2020-06-08 DIAGNOSIS — L089 Local infection of the skin and subcutaneous tissue, unspecified: Secondary | ICD-10-CM | POA: Diagnosis not present

## 2020-06-08 DIAGNOSIS — L820 Inflamed seborrheic keratosis: Secondary | ICD-10-CM | POA: Diagnosis not present

## 2020-06-08 DIAGNOSIS — R208 Other disturbances of skin sensation: Secondary | ICD-10-CM | POA: Diagnosis not present

## 2020-06-08 DIAGNOSIS — L821 Other seborrheic keratosis: Secondary | ICD-10-CM | POA: Diagnosis not present

## 2020-06-08 DIAGNOSIS — L720 Epidermal cyst: Secondary | ICD-10-CM | POA: Diagnosis not present

## 2020-06-08 DIAGNOSIS — L308 Other specified dermatitis: Secondary | ICD-10-CM | POA: Diagnosis not present

## 2020-12-05 DIAGNOSIS — I10 Essential (primary) hypertension: Secondary | ICD-10-CM | POA: Diagnosis not present
